# Patient Record
Sex: Male | Born: 1975 | Race: White | Hispanic: No | Marital: Single | Smoking: Current every day smoker
Health system: Southern US, Community
[De-identification: ages and names within clinical notes are randomized; demographics above are authoritative.]

---

## 2014-10-29 ENCOUNTER — Encounter: Payer: Self-pay | Admitting: Emergency Medicine

## 2014-10-29 ENCOUNTER — Emergency Department
Admission: EM | Admit: 2014-10-29 | Discharge: 2014-10-30 | Disposition: A | Discharge status: Home / Self Care | Payer: BLUE CROSS/BLUE SHIELD | Attending: Emergency Medicine | Admitting: Emergency Medicine

## 2014-10-29 ENCOUNTER — Emergency Department: Payer: BLUE CROSS/BLUE SHIELD

## 2014-10-29 DIAGNOSIS — R079 Chest pain, unspecified: Secondary | ICD-10-CM

## 2014-10-29 DIAGNOSIS — Z720 Tobacco use: Secondary | ICD-10-CM | POA: Diagnosis not present

## 2014-10-29 LAB — COMPREHENSIVE METABOLIC PANEL
ALT: 45 U/L (ref 17–63)
AST: 39 U/L (ref 15–41)
Albumin: 4.7 g/dL (ref 3.5–5.0)
Alkaline Phosphatase: 57 U/L (ref 38–126)
Anion gap: 9 (ref 5–15)
BUN: 12 mg/dL (ref 6–20)
CALCIUM: 9.5 mg/dL (ref 8.9–10.3)
CO2: 25 mmol/L (ref 22–32)
Chloride: 106 mmol/L (ref 101–111)
Creatinine, Ser: 0.79 mg/dL (ref 0.61–1.24)
GFR calc Af Amer: >60 mL/min (ref >60)
GFR calc non Af Amer: >60 mL/min (ref >60)
Glucose, Bld: 94 mg/dL (ref 65–99)
Potassium: 3.9 mmol/L (ref 3.5–5.1)
SODIUM: 140 mmol/L (ref 135–145)
TOTAL PROTEIN: 7.4 g/dL (ref 6.5–8.1)
Total Bilirubin: 1 mg/dL (ref 0.3–1.2)

## 2014-10-29 LAB — CBC
HEMATOCRIT: 50.8 % (ref 40.0–52.0)
HEMOGLOBIN: 17.6 g/dL (ref 13.0–18.0)
MCH: 33 pg (ref 26.0–34.0)
MCHC: 34.7 g/dL (ref 32.0–36.0)
MCV: 95.3 fL (ref 80.0–100.0)
Platelets: 233 10*3/uL (ref 150–440)
RBC: 5.33 MIL/uL (ref 4.40–5.90)
RDW: 12.8 % (ref 11.5–14.5)
WBC: 8 10*3/uL (ref 3.8–10.6)

## 2014-10-29 LAB — TROPONIN I: Troponin I: <0.03 ng/mL (ref <0.031)

## 2014-10-29 NOTE — ED Notes (Signed)
Dr Brown updated on lab results. 

## 2014-10-29 NOTE — Discharge Instructions (Signed)

## 2014-10-29 NOTE — ED Provider Notes (Signed)
[facility] Emergency Department Provider Note    Time seen: 10 PM  I have reviewed the triage vital signs and the nursing notes.   HISTORY  Chief Complaint Chest Pain    HPI Joshua Hendricks is a 39 y.o. male describes chest pain on and off in the left upper chest and shoulder area. He has had some shortness of breath and some fatigue since Monday that is worse with exertion. He describes difficulty going up and down stairs which usually does frequently without any difficulty. Does describe that he is under quite a bit of stress. Pain is dull but seems to make it better or worse.    History reviewed. No pertinent past medical history.  There are no active problems to display for this patient.   History reviewed. No pertinent past surgical history.  No current outpatient prescriptions on file.  Allergies Review of patient's allergies indicates no known allergies.  No family history on file.  Social History History  Substance Use Topics  . Smoking status: Current Every Day Smoker  . Smokeless tobacco: Not on file  . Alcohol Use: Yes    Review of Systems Constitutional: Negative for fever. Eyes: Negative for visual changes. ENT: Negative for sore throat. Cardiovascular: Positive for chest pain Respiratory: Positive for dyspnea on exertion Gastrointestinal: Negative for abdominal pain, vomiting and diarrhea. Genitourinary: Negative for dysuria. Musculoskeletal: Negative for back pain. Skin: Negative for rash. Neurological: Negative for headaches, focal weakness or numbness.  10-point ROS otherwise negative.  ____________________________________________   PHYSICAL EXAM:  VITAL SIGNS: ED Triage Vitals  Enc Vitals Group     BP 10/29/14 1755 150/90 mmHg     Pulse Rate 10/29/14 1755 85     Resp --      Temp 10/29/14 1755 98.2 F (36.8 C)     Temp Source 10/29/14 1755 Oral     SpO2 10/29/14 1755 98 %     Weight 10/29/14 1755 195 lb  (88.451 kg)     Height 10/29/14 1755 6\' 1"  (1.854 m)     Head Cir --      Peak Flow --      Pain Score 10/29/14 1756 2     Pain Loc --      Pain Edu? --      Excl. in GC? --     Constitutional: Alert and oriented. Well appearing and in no distress. Eyes: Conjunctivae are normal. PERRL. Normal extraocular movements. ENT   Head: Normocephalic and atraumatic.   Nose: No congestion/rhinnorhea.   Mouth/Throat: Mucous membranes are moist.   Neck: No stridor. Hematological/Lymphatic/Immunilogical: No cervical lymphadenopathy. Cardiovascular: Normal rate, regular rhythm. Normal and symmetric distal pulses are present in all extremities. No murmurs, rubs, or gallops. Respiratory: Normal respiratory effort without tachypnea nor retractions. Breath sounds are clear and equal bilaterally. No wheezes/rales/rhonchi. Gastrointestinal: Soft and nontender. No distention. No abdominal bruits. There is no CVA tenderness. Musculoskeletal: Nontender with normal range of motion in all extremities. No joint effusions.  No lower extremity tenderness nor edema. Neurologic:  Normal speech and language. No gross focal neurologic deficits are appreciated. Speech is normal. No gait instability. Skin:  Skin is warm, dry and intact. No rash noted. Psychiatric: Mood and affect are normal. Speech and behavior are normal. Patient exhibits appropriate insight and judgment.  ____________________________________________    LABS (pertinent positives/negatives)  Labs Reviewed  CBC  COMPREHENSIVE METABOLIC PANEL  TROPONIN I     ____________________________________________    RADIOLOGY  Chest x-ray  ____________________________________________ EKG: EKG is normal with a rate of 36   ED COURSE  Pertinent labs & imaging results that were available during my care of the patient were reviewed by me and considered in my medical decision making (see chart for details).  We'll check basic labs and  reevaluate.  FINAL ASSESSMENT AND PLAN  Chest pain and dyspnea on exertion next  Plan:    Emily FilbertWilliams, Filicia Scogin E, MD   Emily FilbertJonathan E Yaresly Menzel, MD 10/29/14 [phone]

## 2014-10-29 NOTE — ED Notes (Signed)
MD at bedside. Dr. Brown

## 2014-10-29 NOTE — ED Notes (Signed)
MD at bedside.Dr.Williams 

## 2014-10-29 NOTE — ED Notes (Signed)
Reports cp off and on, sob and fatigue since Monday.

## 2014-10-30 LAB — COMPREHENSIVE METABOLIC PANEL
ALK PHOS: 58 U/L (ref 38–126)
ALT: 44 U/L (ref 17–63)
AST: 39 U/L (ref 15–41)
Albumin: 4.7 g/dL (ref 3.5–5.0)
BILIRUBIN TOTAL: 0.4 mg/dL (ref 0.3–1.2)
BUN: 14 mg/dL (ref 6–20)
CHLORIDE: 105 mmol/L (ref 101–111)
CO2: 21 mmol/L — AB (ref 22–32)
Calcium: 9.4 mg/dL (ref 8.9–10.3)
Creatinine, Ser: 0.87 mg/dL (ref 0.61–1.24)
Glucose, Bld: 84 mg/dL (ref 65–99)
Potassium: 4.1 mmol/L (ref 3.5–5.1)

## 2014-10-30 LAB — TROPONIN I: Troponin I: <0.03 ng/mL (ref <0.031)

## 2017-02-15 ENCOUNTER — Encounter: Payer: Self-pay | Admitting: Emergency Medicine

## 2017-02-15 ENCOUNTER — Emergency Department
Admission: EM | Admit: 2017-02-15 | Discharge: 2017-02-15 | Disposition: A | Discharge status: Home / Self Care | Payer: BLUE CROSS/BLUE SHIELD | Attending: Emergency Medicine | Admitting: Emergency Medicine

## 2017-02-15 ENCOUNTER — Emergency Department: Payer: BLUE CROSS/BLUE SHIELD

## 2017-02-15 DIAGNOSIS — F17200 Nicotine dependence, unspecified, uncomplicated: Secondary | ICD-10-CM | POA: Diagnosis not present

## 2017-02-15 DIAGNOSIS — I4891 Unspecified atrial fibrillation: Secondary | ICD-10-CM | POA: Insufficient documentation

## 2017-02-15 DIAGNOSIS — R0602 Shortness of breath: Secondary | ICD-10-CM | POA: Diagnosis present

## 2017-02-15 LAB — CBC
HCT: 53.6 % — ABNORMAL HIGH (ref 40.0–52.0)
Hemoglobin: 18.8 g/dL — ABNORMAL HIGH (ref 13.0–18.0)
MCH: 33.2 pg (ref 26.0–34.0)
MCHC: 35.1 g/dL (ref 32.0–36.0)
MCV: 94.4 fL (ref 80.0–100.0)
Platelets: 263 10*3/uL (ref 150–440)
RBC: 5.68 MIL/uL (ref 4.40–5.90)
RDW: 12.7 % (ref 11.5–14.5)
WBC: 11.1 10*3/uL — ABNORMAL HIGH (ref 3.8–10.6)

## 2017-02-15 LAB — BASIC METABOLIC PANEL
Anion gap: 9 (ref 5–15)
BUN: 7 mg/dL (ref 6–20)
CALCIUM: 9.7 mg/dL (ref 8.9–10.3)
CO2: 27 mmol/L (ref 22–32)
Chloride: 106 mmol/L (ref 101–111)
Creatinine, Ser: 0.97 mg/dL (ref 0.61–1.24)
GFR calc Af Amer: >60 mL/min (ref >60)
GFR calc non Af Amer: >60 mL/min (ref >60)
Glucose, Bld: 105 mg/dL — ABNORMAL HIGH (ref 65–99)
Potassium: 4 mmol/L (ref 3.5–5.1)
Sodium: 142 mmol/L (ref 135–145)

## 2017-02-15 LAB — MAGNESIUM: MAGNESIUM: 2 mg/dL (ref 1.7–2.4)

## 2017-02-15 LAB — TROPONIN I: Troponin I: <0.03 ng/mL (ref <0.03)

## 2017-02-15 LAB — TSH: TSH: 1.932 u[IU]/mL (ref 0.350–4.500)

## 2017-02-15 MED ORDER — DILTIAZEM HCL ER COATED BEADS 120 MG PO CP24: 120.0000 mg | ORAL_CAPSULE | Freq: Every day | ORAL | 0 refills | Status: AC

## 2017-02-15 MED ORDER — ASPIRIN EC 81 MG PO TBEC: 81.0000 mg | DELAYED_RELEASE_TABLET | Freq: Every day | ORAL | 0 refills | Status: AC

## 2017-02-15 MED ORDER — METOPROLOL TARTRATE 5 MG/5ML IV SOLN
5.0000 mg | Freq: Once | INTRAVENOUS | Status: AC
Start: 2017-02-15 — End: 2017-02-15
  Administered 2017-02-15: 5 mg via INTRAVENOUS
  Filled 2017-02-15: qty 5

## 2017-02-15 MED ORDER — SODIUM CHLORIDE 0.9 % IV BOLUS (SEPSIS)
1000.0000 mL | Freq: Once | INTRAVENOUS | Status: AC
  Administered 2017-02-15: 1000 mL via INTRAVENOUS

## 2017-02-15 MED ORDER — DILTIAZEM HCL 100 MG IV SOLR
5.0000 mg/h | Freq: Once | INTRAVENOUS | Status: DC
  Filled 2017-02-15: qty 100

## 2017-02-15 MED ORDER — DILTIAZEM HCL 60 MG PO TABS
60.0000 mg | ORAL_TABLET | Freq: Once | ORAL | Status: AC
  Administered 2017-02-15: 60 mg via ORAL
  Filled 2017-02-15 (×2): qty 1

## 2017-02-15 MED ORDER — METOPROLOL TARTRATE 5 MG/5ML IV SOLN
INTRAVENOUS | Status: AC
  Administered 2017-02-15: 5 mg via INTRAVENOUS
  Filled 2017-02-15: qty 5

## 2017-02-15 MED ORDER — METOPROLOL TARTRATE 5 MG/5ML IV SOLN
5.0000 mg | Freq: Once | INTRAVENOUS | Status: AC
  Administered 2017-02-15: 5 mg via INTRAVENOUS

## 2017-02-15 NOTE — Discharge Instructions (Signed)
Please follow-up with cardiology today or tomorrow for further evaluation and Holter monitor placement. Please take your medications as prescribed each day. Return to the emergency department for any shortness of breath, dizziness or chest pain.

## 2017-02-15 NOTE — ED Notes (Signed)
Admission MD at bedside.  

## 2017-02-15 NOTE — ED Notes (Signed)
ED Provider at bedside. 

## 2017-02-15 NOTE — Progress Notes (Signed)
Patient ID: Joshua Hendricks, male   DOB: 09/14/1975, 41 y.o.   MRN: [id number]  I was asked to see the patient for rapid atrial fibrillation. When I walked in the room the patient stated he wanted to go home. Heart rate was in the 100s to 120s. I spoke with the ER physician, to try oral Cardizem short acting and also a longer acting medication and see if we can control heart rate to get him home. Also advised some IV magnesium. Advise checking TSH. ER physician already spoke with cardiology as follow-up.  ER physician will call me back if unable to control heart rate.  Dr. Alford Highlandichard Lenah Messenger

## 2017-02-15 NOTE — ED Notes (Signed)
Patient transported to X-ray 

## 2017-02-15 NOTE — ED Notes (Signed)
Md at bedside attempting to do vagal maneuvers with patient at this time. Vagal maneuvers unsuccessful at this time.

## 2017-02-15 NOTE — ED Triage Notes (Signed)
Patient presents to ED via POV from PCP office due to a fib. Patient c/o palpations and shortness of breath. No medical history.

## 2017-02-15 NOTE — ED Provider Notes (Addendum)
[facility] Emergency Department Provider Note  Time seen: 11:15 AM  I have reviewed the triage vital signs and the nursing notes.   HISTORY  Chief Complaint Shortness of Breath    HPI Joshua Hendricks is a 41 y.o. male with no past medical history presents to the emergency department for palpitations and a rapid heart rate. According to the patient last night he developed palpitations and shortness of breath in the evening. States he went to bed and upon awakening this morning continued to have palpitations or shortness of breath so he called his doctor. His doctor saw him in the office and sent him to the ER for rapid heart rate. Upon arrival patient has a heart rate in the 160-180 range it appears to be irregular. Patient has no history of irregular heartbeat. He does admit to  dailyalcohol use. Patient denies any chest pain but does state mild shortness of breath. Denies any recent abdominal pain nausea vomiting or diarrhea.  History reviewed. No pertinent past medical history.  There are no active problems to display for this patient.   History reviewed. No pertinent surgical history.  Prior to Admission medications   Not on File    No Known Allergies  No family history on file.  Social History Social History  Substance Use Topics  . Smoking status: Current Every Day Smoker  . Smokeless tobacco: Not on file  . Alcohol use Yes    Review of Systems Constitutional: Negative for fever. Cardiovascular: Negative for chest pain.Palpitations last night Respiratory: Mild shortness of breath Gastrointestinal: Negative for abdominal pain, vomiting and diarrhea. Musculoskeletal: No leg pain or swelling Neurological: Negative for headache All other ROS negative  ____________________________________________   PHYSICAL EXAM:  VITAL SIGNS: ED Triage Vitals  Enc Vitals Group     BP 02/15/17 1104 129/85     Pulse Rate 02/15/17 1104 87     Resp  02/15/17 1104 20     Temp --      Temp src --      SpO2 02/15/17 1104 100 %     Weight 02/15/17 1105 188 lb (85.3 kg)     Height 02/15/17 1105 6\' 1"  (1.854 m)     Head Circumference --      Peak Flow --      Pain Score --      Pain Loc --      Pain Edu? --      Excl. in GC? --     Constitutional: Alert and oriented. Well appearing and in no distress. Eyes: Normal exam ENT   Head: Normocephalic and atraumatic.   Mouth/Throat: Mucous membranes are moist. Cardiovascular: Irregular rhythm, rate around 150 bpm no obvious murmur Respiratory: Normal respiratory effort without tachypnea nor retractions. Breath sounds are clear and equal bilaterally. No wheezes/rales/rhonchi. Gastrointestinal: Soft and nontender. No distention.  Musculoskeletal: Nontender with normal range of motion in all extremities. No lower extremity tenderness or edema. Neurologic:  Normal speech and language. No gross focal neurologic deficits  Skin:  Skin is warm, dry and intact.  Psychiatric: Mood and affect are normal.   ____________________________________________    EKG  EKG reviewed and interpreted by myself shows atrial fibrillation with rapid ventricular response of 166 bpm, narrow QS, normal axis, normal intervals, nonspecific ST changes. No ST elevation.  ____________________________________________    RADIOLOGY  IMPRESSION: There is no CHF nor other acute cardiopulmonary abnormality. Mild chronic interstitial prominence is consistent with the patient's smoking history.  ____________________________________________   INITIAL IMPRESSION / ASSESSMENT AND PLAN / ED COURSE  Pertinent labs & imaging results that were available during my care of the patient were reviewed by me and considered in my medical decision making (see chart for details).  Patient presents to the emergency department for a rapid heart rate found to be in the 160s upon arrival. Attempted vagal maneuvers without  success, after further review the Heart rate is irregular most consistent with atrial fibrillation with rapid ventricular response.   We will dose 5 mg of metoprolol IV, IV fluids check labs and closely monitor. Currently the patient appears well, no distress.  CRITICAL CARE Performed by: Minna AntisPADUCHOWSKI, Denene Alamillo   Total critical care time: 30 minutes  Critical care time was exclusive of separately billable procedures and treating other patients.  Critical care was necessary to treat or prevent imminent or life-threatening deterioration.  Critical care was time spent personally by me on the following activities: development of treatment plan with patient and/or surrogate as well as nursing, discussions with consultants, evaluation of patient's response to treatment, examination of patient, obtaining history from patient or surrogate, ordering and performing treatments and interventions, ordering and review of laboratory studies, ordering and review of radiographic studies, pulse oximetry and re-evaluation of patient's condition.   I discussed the patient with Dr.Paraschos, who recommends that the patient can be rate controlled he could be discharged home given his low risk status. Would recommend starting the patient on aspirin once daily in addition to metoprolol twice a day and have the patient come to the office for Holter monitor placement. He states he will then follow-up with the patient in 48 hours to see if the patient remains in atrial fibrillation. Currently the patient appears well, heart rate is down to 100 110 bpm. Denies any symptoms at this time. We will dose 5 more milligrams of metoprolol in an attempt to rate control the patient. If successful we will discharge with the above plan. Patient agreeable to this plan.  Patient continues to be in atrial fibrillation with rapid ventricular response between 110-140 bpm. Despite 2 rounds of IV metoprolol. We will start the patient on a  diltiazem drip and admitted to the hospital for further treatment.   Patient is now refusing admission to the hospital, does not want to be admitted and wishes to go home. His heart rate does continue to fluctuate between 95 bpm 125 bpm. We will dose oral diltiazem, patient will follow up with Dr. Darrold JunkerParaschos.   I discussed risks with the patient, he understands but still wishes to be discharged home.   ____________________________________________   FINAL CLINICAL IMPRESSION(S) / ED DIAGNOSES  Atrial fibrillation with rapid ventricular response New-onset atrial fibrillation    Minna AntisPaduchowski, Oluwafemi Villella, MD 02/15/17 1356    Minna AntisPaduchowski, Corissa Oguinn, MD 02/15/17 1416

## 2017-02-15 NOTE — ED Notes (Signed)
Walked pt over to [facility] cardiology to front desk. Pt and family verbalized understanding of appointment and prescriptions.

## 2020-12-06 ENCOUNTER — Encounter: Payer: Self-pay | Admitting: Emergency Medicine

## 2020-12-06 ENCOUNTER — Emergency Department
Admission: EM | Admit: 2020-12-06 | Discharge: 2020-12-06 | Disposition: A | Discharge status: Home / Self Care | Payer: 59 | Attending: Emergency Medicine | Admitting: Emergency Medicine

## 2020-12-06 ENCOUNTER — Emergency Department: Payer: 59

## 2020-12-06 ENCOUNTER — Other Ambulatory Visit: Payer: Self-pay

## 2020-12-06 DIAGNOSIS — F17200 Nicotine dependence, unspecified, uncomplicated: Secondary | ICD-10-CM | POA: Insufficient documentation

## 2020-12-06 DIAGNOSIS — M24212 Disorder of ligament, left shoulder: Secondary | ICD-10-CM | POA: Diagnosis not present

## 2020-12-06 DIAGNOSIS — S4992XA Unspecified injury of left shoulder and upper arm, initial encounter: Secondary | ICD-10-CM | POA: Diagnosis not present

## 2020-12-06 DIAGNOSIS — Y92410 Unspecified street and highway as the place of occurrence of the external cause: Secondary | ICD-10-CM | POA: Insufficient documentation

## 2020-12-06 DIAGNOSIS — S2242XA Multiple fractures of ribs, left side, initial encounter for closed fracture: Secondary | ICD-10-CM | POA: Insufficient documentation

## 2020-12-06 DIAGNOSIS — S299XXA Unspecified injury of thorax, initial encounter: Secondary | ICD-10-CM | POA: Diagnosis present

## 2020-12-06 MED ORDER — HYDROCODONE-ACETAMINOPHEN 5-325 MG PO TABS: 1.0000 | ORAL_TABLET | Freq: Three times a day (TID) | ORAL | 0 refills | Status: AC | PRN

## 2020-12-06 MED ORDER — HYDROCODONE-ACETAMINOPHEN 5-325 MG PO TABS
1.0000 | ORAL_TABLET | Freq: Once | ORAL | Status: AC
  Administered 2020-12-06: 12:00:00 1 via ORAL
  Filled 2020-12-06: qty 1
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
## 2020-12-06 NOTE — ED Notes (Signed)
Pt called ride to come pick him up due to getting narcotic pain medication.

## 2021-11-10 DIAGNOSIS — Z86018 Personal history of other benign neoplasm: Secondary | ICD-10-CM | POA: Diagnosis not present

## 2021-11-10 DIAGNOSIS — D485 Neoplasm of uncertain behavior of skin: Secondary | ICD-10-CM | POA: Diagnosis not present

## 2021-11-10 DIAGNOSIS — B360 Pityriasis versicolor: Secondary | ICD-10-CM | POA: Diagnosis not present

## 2021-11-10 DIAGNOSIS — D224 Melanocytic nevi of scalp and neck: Secondary | ICD-10-CM | POA: Diagnosis not present

## 2021-11-10 DIAGNOSIS — D2271 Melanocytic nevi of right lower limb, including hip: Secondary | ICD-10-CM | POA: Diagnosis not present

## 2021-11-10 DIAGNOSIS — L578 Other skin changes due to chronic exposure to nonionizing radiation: Secondary | ICD-10-CM | POA: Diagnosis not present
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
